# Patient Record
Sex: Female | Born: 2000 | Race: White | Hispanic: No | Marital: Single | Smoking: Never smoker
Health system: Southern US, Community
[De-identification: ages and names within clinical notes are randomized; demographics above are authoritative.]

## PROBLEM LIST (undated history)

## (undated) DIAGNOSIS — J45909 Unspecified asthma, uncomplicated: Secondary | ICD-10-CM

---
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
## 2022-04-13 ENCOUNTER — Emergency Department (HOSPITAL_BASED_OUTPATIENT_CLINIC_OR_DEPARTMENT_OTHER)
Admission: EM | Admit: 2022-04-13 | Discharge: 2022-04-13 | Discharge status: Absent without leave | Payer: 59 | Source: Home / Self Care

## 2022-06-05 IMAGING — US US PELVIS COMPLETE
1 series · 13 of 25 positions shown · non-contrast
Comparison: None available.

CLINICAL DATA: Initial evaluation for acute left lower quadrant
pain.

EXAM:
TRANSABDOMINAL ULTRASOUND OF PELVIS
DOPPLER ULTRASOUND OF OVARIES
TECHNIQUE: Transabdominal ultrasound examination of the pelvis was performed
including evaluation of the uterus, ovaries, adnexal regions, and
pelvic cul-de-sac.
Color and duplex Doppler ultrasound was utilized to evaluate blood
flow to the ovaries.

[Series 1: us pelvis complete · 13 of 51 slices shown]
[im 1/51]
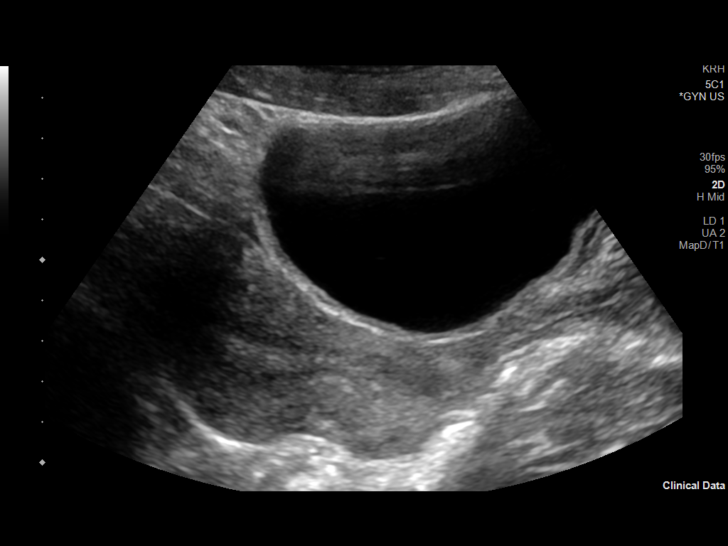
[im 5/51]
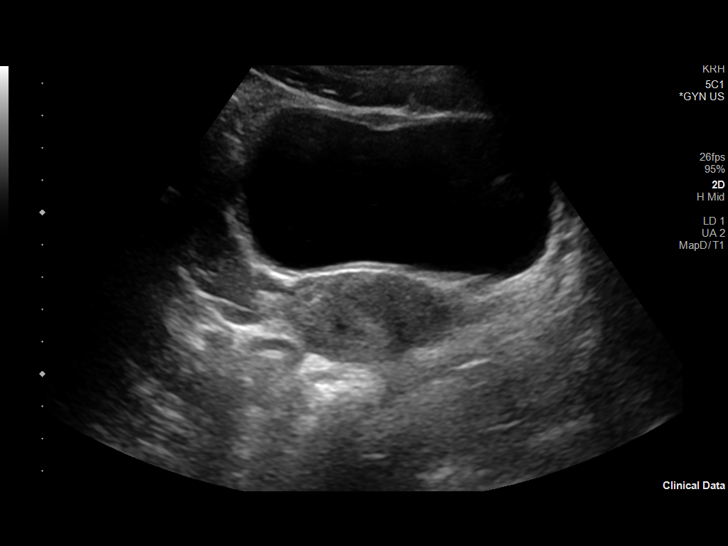
[im 9/51]
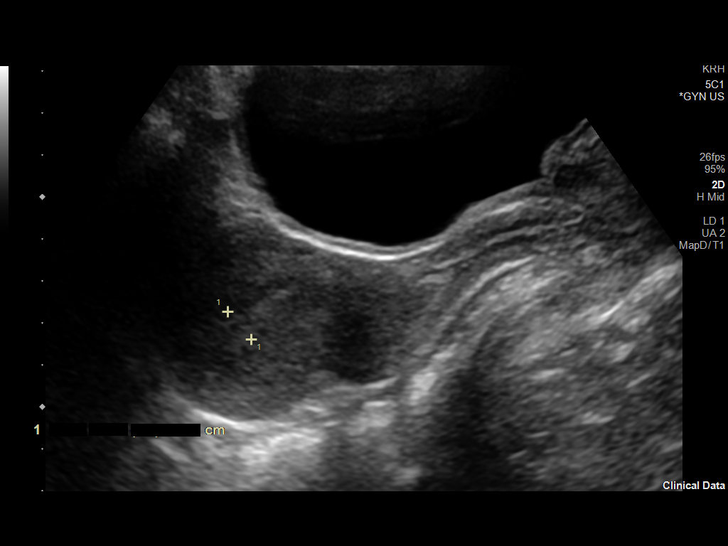
[im 13/51]
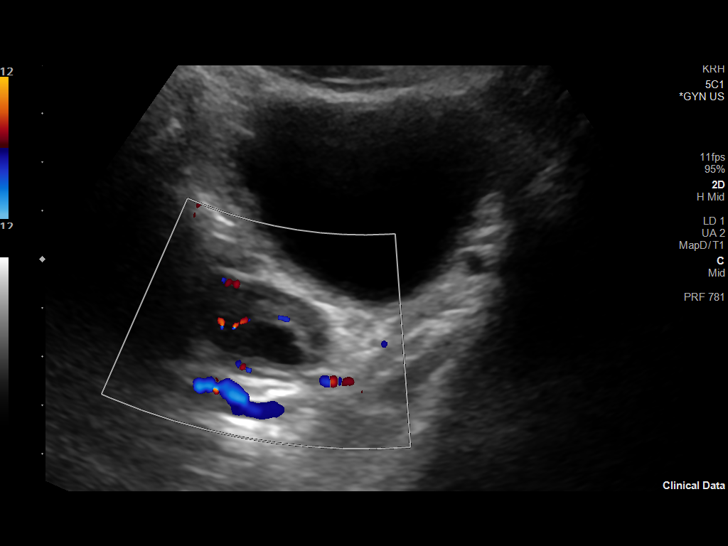
[im 17/51]
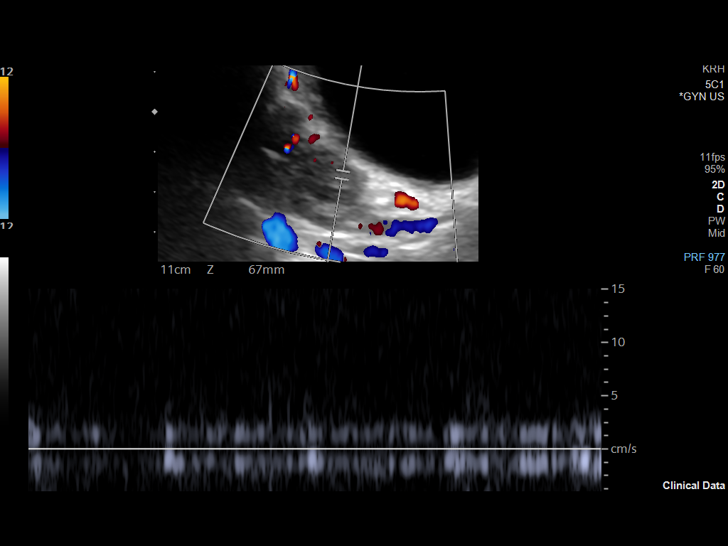
[im 21/51]
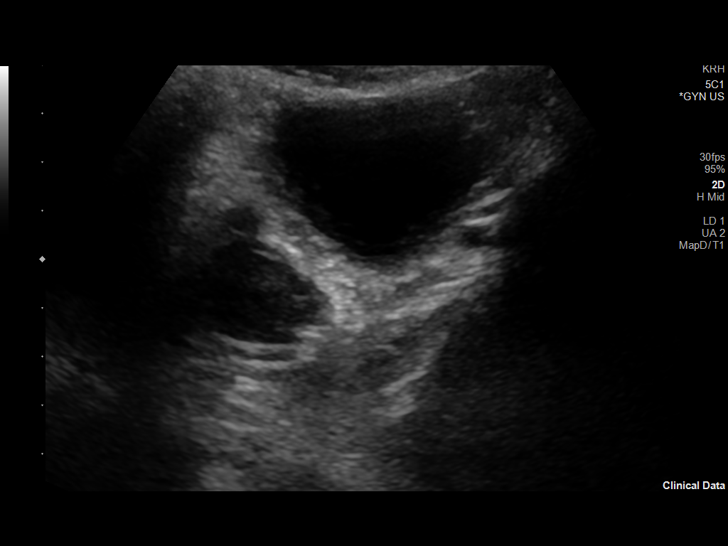
[im 26/51]
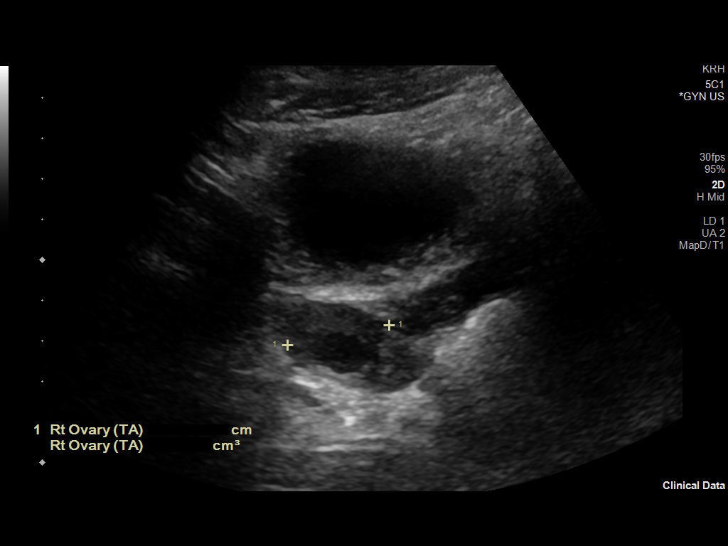
[im 30/51]
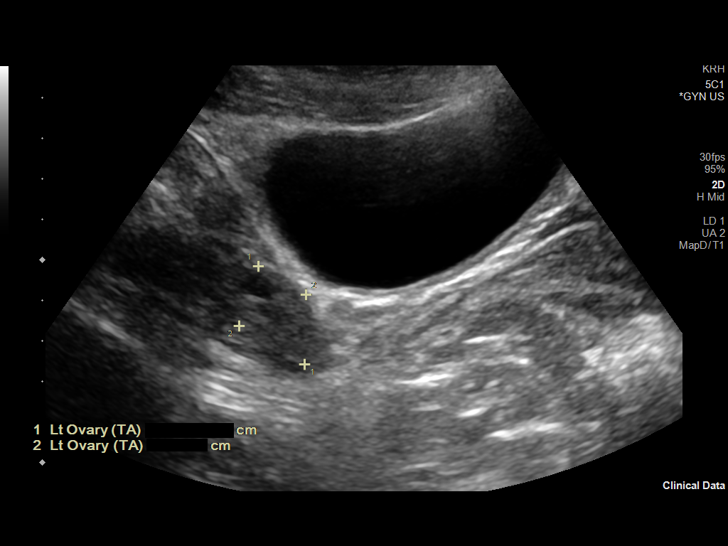
[im 34/51]
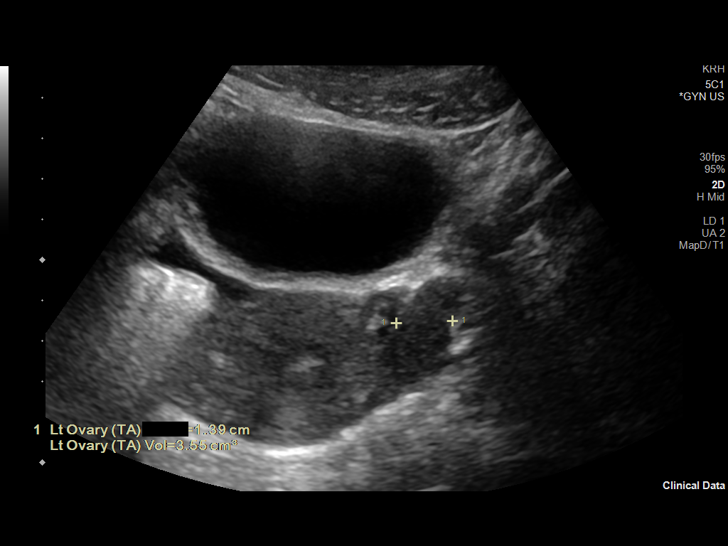
[im 38/51]
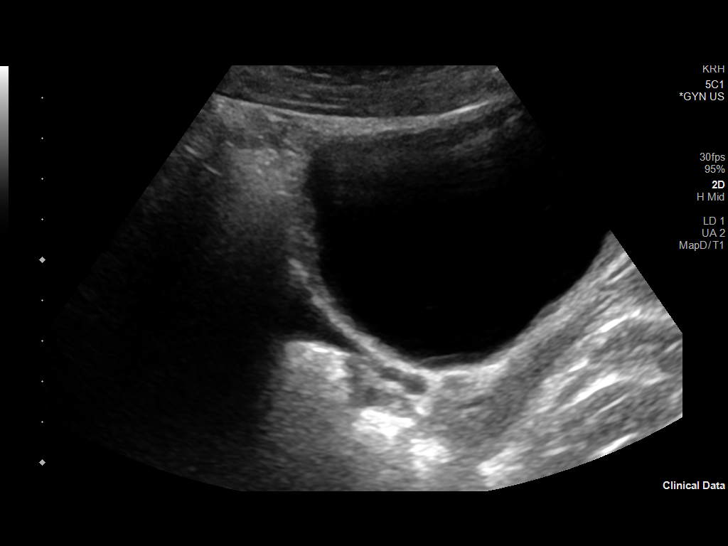
[im 42/51]
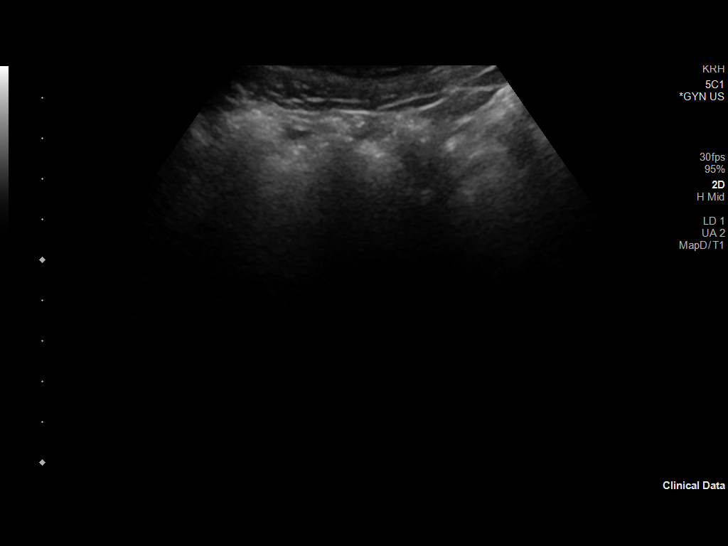
[im 46/51]
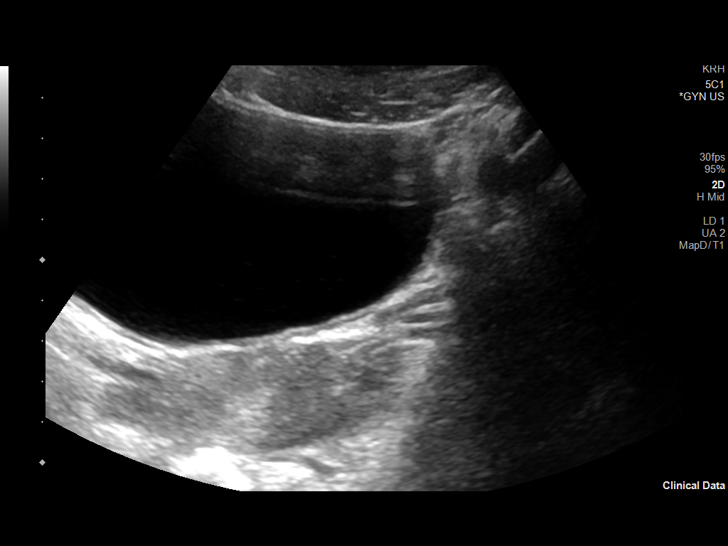
[im 51/51]
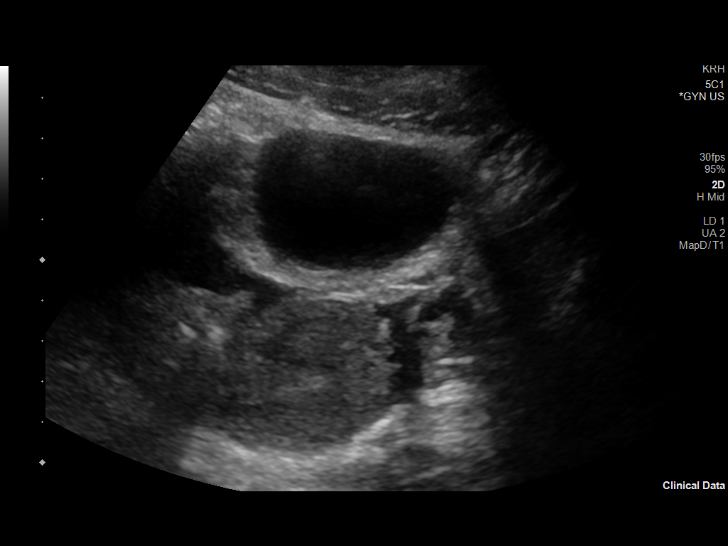

[13 of 25 positions shown; findings below may reference images not displayed]

FINDINGS: Uterus

Measurements: 7.4 x 3.7 x 4.8 cm = volume: 68 mL. Uterus is
anteverted. No discrete fibroid or other mass.

Endometrium

Thickness: 9 mm.  No focal abnormality visualized.

Right ovary

Measurements: 2.7 x 1.9 x 2.6 cm = volume: 6.7 mL. 1.7 x 1.0 x
cm complex hypoechoic cyst, which could reflect a hemorrhagic cyst
or possibly complex physiologic follicular cysts with internal
proteinaceous material and/or debris. No associated vascularity or
solid component.

Left ovary

Measurements: 2.7 x 1.8 x 1.4 cm = volume: 3.6 mL. Normal
appearance/no adnexal mass.

Pulsed Doppler evaluation demonstrates normal low-resistance
arterial and venous waveforms in both ovaries.

Other: Small volume free fluid present within the right lower
quadrant.
IMPRESSION: 1. 1.7 cm complex right ovarian cyst, which could reflect a
hemorrhagic cyst or possibly a complex physiologic follicular cyst
with internal proteinaceous material. While this is almost certainly
benign, a short interval follow-up ultrasound in 6-12 weeks could be
performed to ensure resolution as clinically warranted.
2. Associated small volume free fluid within the right lower
quadrant.
3. Otherwise unremarkable and normal pelvic ultrasound. No evidence
for torsion.
